# Patient Record
Sex: Male | Born: 1960 | Race: White | Hispanic: No | Marital: Married
Health system: Southern US, Community
[De-identification: ages and names within clinical notes are randomized; demographics above are authoritative.]

---

## 2011-04-07 ENCOUNTER — Ambulatory Visit: Payer: Self-pay | Admitting: Internal Medicine

## 2018-06-25 ENCOUNTER — Emergency Department: Payer: BLUE CROSS/BLUE SHIELD

## 2018-06-25 ENCOUNTER — Other Ambulatory Visit: Payer: Self-pay

## 2018-06-25 ENCOUNTER — Emergency Department
Admission: EM | Admit: 2018-06-25 | Discharge: 2018-06-25 | Disposition: A | Discharge status: Home / Self Care | Payer: BLUE CROSS/BLUE SHIELD | Attending: Emergency Medicine | Admitting: Emergency Medicine

## 2018-06-25 DIAGNOSIS — M5416 Radiculopathy, lumbar region: Secondary | ICD-10-CM | POA: Diagnosis not present

## 2018-06-25 DIAGNOSIS — K4090 Unilateral inguinal hernia, without obstruction or gangrene, not specified as recurrent: Secondary | ICD-10-CM

## 2018-06-25 DIAGNOSIS — K4021 Bilateral inguinal hernia, without obstruction or gangrene, recurrent: Secondary | ICD-10-CM | POA: Insufficient documentation

## 2018-06-25 DIAGNOSIS — R1031 Right lower quadrant pain: Secondary | ICD-10-CM | POA: Diagnosis present

## 2018-06-25 DIAGNOSIS — K5790 Diverticulosis of intestine, part unspecified, without perforation or abscess without bleeding: Secondary | ICD-10-CM | POA: Insufficient documentation

## 2018-06-25 LAB — CBC
HCT: 45.5 % (ref 39.0–52.0)
Hemoglobin: 15.4 g/dL (ref 13.0–17.0)
MCH: 31.1 pg (ref 26.0–34.0)
MCHC: 33.8 g/dL (ref 30.0–36.0)
MCV: 91.9 fL (ref 80.0–100.0)
Platelets: 352 10*3/uL (ref 150–400)
RBC: 4.95 MIL/uL (ref 4.22–5.81)
RDW: 13.2 % (ref 11.5–15.5)
WBC: 11.8 10*3/uL — ABNORMAL HIGH (ref 4.0–10.5)
nRBC: 0 % (ref 0.0–0.2)

## 2018-06-25 LAB — COMPREHENSIVE METABOLIC PANEL
ALT: 24 U/L (ref 0–44)
AST: 27 U/L (ref 15–41)
Albumin: 4.1 g/dL (ref 3.5–5.0)
Alkaline Phosphatase: 75 U/L (ref 38–126)
Anion gap: 9 (ref 5–15)
BUN: 12 mg/dL (ref 6–20)
CO2: 22 mmol/L (ref 22–32)
Calcium: 8.9 mg/dL (ref 8.9–10.3)
Chloride: 104 mmol/L (ref 98–111)
Creatinine, Ser: 0.89 mg/dL (ref 0.61–1.24)
GFR calc Af Amer: >60 mL/min (ref >60)
GFR calc non Af Amer: >60 mL/min (ref >60)
Glucose, Bld: 112 mg/dL — ABNORMAL HIGH (ref 70–99)
Potassium: 3.8 mmol/L (ref 3.5–5.1)
Sodium: 135 mmol/L (ref 135–145)
Total Bilirubin: 0.8 mg/dL (ref 0.3–1.2)
Total Protein: 7.2 g/dL (ref 6.5–8.1)

## 2018-06-25 LAB — URINALYSIS, COMPLETE (UACMP) WITH MICROSCOPIC
BACTERIA UA: NONE SEEN
Bilirubin Urine: NEGATIVE
Glucose, UA: NEGATIVE mg/dL
Ketones, ur: 5 mg/dL — AB
Leukocytes, UA: NEGATIVE
Nitrite: NEGATIVE
Protein, ur: NEGATIVE mg/dL
Specific Gravity, Urine: 1.017 (ref 1.005–1.030)
pH: 6 (ref 5.0–8.0)

## 2018-06-25 LAB — LIPASE, BLOOD: Lipase: 33 U/L (ref 11–51)

## 2018-06-25 MED ORDER — ONDANSETRON HCL 4 MG/2ML IJ SOLN
4.0000 mg | Freq: Once | INTRAMUSCULAR | Status: AC
  Administered 2018-06-25: 4 mg via INTRAVENOUS

## 2018-06-25 MED ORDER — IOPAMIDOL (ISOVUE-300) INJECTION 61%
100.0000 mL | Freq: Once | INTRAVENOUS | Status: AC | PRN
  Administered 2018-06-25: 100 mL via INTRAVENOUS
  Filled 2018-06-25: qty 100

## 2018-06-25 MED ORDER — MORPHINE SULFATE (PF) 4 MG/ML IV SOLN
4.0000 mg | Freq: Once | INTRAVENOUS | Status: AC
  Administered 2018-06-25: 4 mg via INTRAVENOUS

## 2018-06-25 MED ORDER — MORPHINE SULFATE (PF) 4 MG/ML IV SOLN
INTRAVENOUS | Status: AC
  Filled 2018-06-25: qty 1

## 2018-06-25 MED ORDER — ONDANSETRON HCL 4 MG/2ML IJ SOLN
INTRAMUSCULAR | Status: AC
  Filled 2018-06-25: qty 2

## 2018-06-25 NOTE — ED Notes (Signed)
First Nurse Note: Patient states "my hernia has popped out and I can't get it back".  Patient indicates right lower abdomen.  Patient states the hernia came out at approx. 0630 this AM.

## 2018-06-25 NOTE — ED Provider Notes (Signed)
[facility] Emergency Department Provider Note ______   First MD Initiated Contact with Patient 06/25/18 0935     (approximate)  I have reviewed the triage vital signs and the nursing notes.   HISTORY  Chief Complaint Abdominal Pain    HPI George Phillips is a 58 y.o. male with history of right inguinal hernia presents to the emergency department with acute onset of right inguinal pain following coughing this morning.  Patient states that the area started to bulge twice as large as usual completing 10 out of 10 discomfort.  Patient denies any nausea vomiting diarrhea constipation.  Patient denies any urinary symptoms.   Past medical history Right inguinal hernia There are no active problems to display for this patient.     Prior to Admission medications   Not on File    Allergies Drug allergies No family history on file.  Social History Social History   Tobacco Use  . Smoking status: Not on file  Substance Use Topics  . Alcohol use: Not on file  . Drug use: Not on file    Review of Systems Constitutional: No fever/chills Eyes: No visual changes. ENT: No sore throat. Cardiovascular: Denies chest pain. Respiratory: Denies shortness of breath. Gastrointestinal: Positive for right inguinal pain.   No nausea, no vomiting.  No diarrhea.  No constipation. Genitourinary: Negative for dysuria. Musculoskeletal: Negative for neck pain.  Negative for back pain. Integumentary: Negative for rash. Neurological: Negative for headaches, focal weakness or numbness.   ____________________________________________   PHYSICAL EXAM:  VITAL SIGNS: ED Triage Vitals  Enc Vitals Group     BP 06/25/18 0827 (!) 179/114     Pulse Rate 06/25/18 0827 (!) 111     Resp 06/25/18 0827 20     Temp 06/25/18 0827 97.8 F (36.6 C)     Temp Source 06/25/18 0827 Oral     SpO2 06/25/18 0827 98 %     Weight 06/25/18 0828 70.3 kg (155 lb)     Height 06/25/18 0828  1.702 m (5\' 7" )     Head Circumference --      Peak Flow --      Pain Score 06/25/18 0828 6     Pain Loc --      Pain Edu? --      Excl. in GC? --     Constitutional: Alert and oriented.  Apparent discomfort.   Eyes: Conjunctivae are normal.  Mouth/Throat: Mucous membranes are moist. Oropharynx non-erythematous. Neck: No stridor. Cardiovascular: Normal rate, regular rhythm. Good peripheral circulation. Grossly normal heart sounds. Respiratory: Normal respiratory effort.  No retractions. Lungs CTAB. Gastrointestinal: Palpable right inguinal hernia.. No distention.  Musculoskeletal: No lower extremity tenderness nor edema. No gross deformities of extremities. Neurologic:  Normal speech and language. No gross focal neurologic deficits are appreciated.  Skin:  Skin is warm, dry and intact. No rash noted.  ____________________________________________   LABS (all labs ordered are listed, but only abnormal results are displayed)  Labs Reviewed  COMPREHENSIVE METABOLIC PANEL - Abnormal; Notable for the following components:      Result Value   Glucose, Bld 112 (*)    All other components within normal limits  CBC - Abnormal; Notable for the following components:   WBC 11.8 (*)    All other components within normal limits  URINALYSIS, COMPLETE (UACMP) WITH MICROSCOPIC - Abnormal; Notable for the following components:   Color, Urine YELLOW (*)    APPearance CLEAR (*)  Hgb urine dipstick SMALL (*)    Ketones, ur 5 (*)    All other components within normal limits  LIPASE, BLOOD    ____________________________________________  RADIOLOGY I, [place] N Olinda Nola, personally viewed and evaluated these images (plain radiographs) as part of my medical decision making, as well as reviewing the written report by the radiologist.  ED MD interpretation: Small bilateral inguinal hernia right greater than the left only containing fat.  Diverticulosis and L5-S1 soft disc bulge with right S1 nerve  compression noted on CT scan per radiologist.  Official radiology report(s): Ct Abdomen Pelvis W Contrast  Result Date: 06/25/2018 CLINICAL DATA:  Right inguinal pain and bulging after the patient coughed this morning. EXAM: CT ABDOMEN AND PELVIS WITH CONTRAST TECHNIQUE: Multidetector CT imaging of the abdomen and pelvis was performed using the standard protocol following bolus administration of intravenous contrast. CONTRAST:  100mL ISOVUE-300 IOPAMIDOL (ISOVUE-300) INJECTION 61% COMPARISON:  None. FINDINGS: Lower chest: Normal. Hepatobiliary: No focal liver abnormality is seen. No gallstones, gallbladder wall thickening, or biliary dilatation. Pancreas: Unremarkable. No pancreatic ductal dilatation or surrounding inflammatory changes. Spleen: Normal in size without focal abnormality. Adrenals/Urinary Tract: Adrenal glands are unremarkable. Kidneys are normal, without renal calculi, focal lesion, or hydronephrosis. Bladder is unremarkable. Stomach/Bowel: Stomach is within normal limits. Appendix appears normal. No evidence of bowel wall thickening, distention, or inflammatory changes. There are a few scattered diverticula in the colon. No diverticulitis. Vascular/Lymphatic: No significant vascular findings are present. No enlarged abdominal or pelvic lymph nodes. Reproductive: Prostate is unremarkable. Other: There are small bilateral inguinal hernias containing fat, larger on the right than the left with slight soft tissue stranding in the right hernia. There is no bowel within the hernias. No ascites. Musculoskeletal: No acute abnormalities. Slight arthritis of the left hip. Degenerative disc disease at L5-S1 with a soft disc extrusion extending inferiorly in the right lateral recess compressing the right S1 nerve. IMPRESSION: 1. Small bilateral inguinal hernias, right greater than left containing only fat. Slight soft tissue stranding in the right inguinal hernia. 2. Soft disc extrusion at L5-S1 to the right  compressing the right S1 nerve. 3. Scattered diverticula in the colon. Electronically Signed   By: Francene BoyersJames  Maxwell M.D.   On: 06/25/2018 10:36      Procedures   ____________________________________________   INITIAL IMPRESSION / ASSESSMENT AND PLAN / ED COURSE  As part of my medical decision making, I reviewed the following data within the electronic MEDICAL RECORD NUMBER   58 year old male presenting with above-stated history and physical exam concerning for possible incarcerated right inguinal hernia.  Patient given IV morphine 4 mg and Zofran 4 mg.  Following administration.  Hernia was manually reduced with resolution of pain.  CT scan revealed bilateral inguinal hernia with only fat noted within the hernia.  Patient notified of all clinical findings including diverticulosis and lumbar radiculopathy L5-S1 disc protrusion. ____________________________________________  FINAL CLINICAL IMPRESSION(S) / ED DIAGNOSES  Final diagnoses:  Right inguinal hernia  Bilateral recurrent inguinal hernia without obstruction or gangrene  Diverticulosis  Lumbar radiculopathy     MEDICATIONS GIVEN DURING THIS VISIT:  Medications  morphine 4 MG/ML injection 4 mg (4 mg Intravenous Given 06/25/18 0948)  ondansetron (ZOFRAN) injection 4 mg (4 mg Intravenous Given 06/25/18 0949)  iopamidol (ISOVUE-300) 61 % injection 100 mL (100 mLs Intravenous Contrast Given 06/25/18 1014)     ED Discharge Orders    None       Note:  This document was prepared using Dragon  voice recognition software and may include unintentional dictation errors.     Darci CurrentBrown, Heber N, MD 06/25/18 1059

## 2018-06-25 NOTE — ED Triage Notes (Signed)
Patient states he coughed at approx 0630 this AM and "my hernia popped out".  Has obvious right inguinal enlargement.  Patient states he has never been diagnosed with a hernia.  States he can usually reduce it himself but unable to push it back in this time.

## 2018-06-25 NOTE — ED Notes (Signed)
Patient presents c/o hernia "popping out" around 6:30am--Dr Manson Passey to bedside for examination.

## 2020-07-10 IMAGING — CT CT ABD-PELV W/ CM
2 of 5 series · 16 of 46 positions shown, 18 images · IV contrast (APPLIED)
Comparison: None.

CLINICAL DATA: Right inguinal pain and bulging after the patient
coughed this morning.

EXAM:
CT ABDOMEN AND PELVIS WITH CONTRAST
TECHNIQUE: Multidetector CT imaging of the abdomen and pelvis was performed
using the standard protocol following bolus administration of
intravenous contrast.
CONTRAST:  100mL [id number] IOPAMIDOL ([id number]) INJECTION 61%

[Series 2: routine abd/pel with · axial · 0.69mm/px · z∈[-664,-254]mm · 13 of 92 slices shown, 15 images]
[im 5/92  soft-tissue]
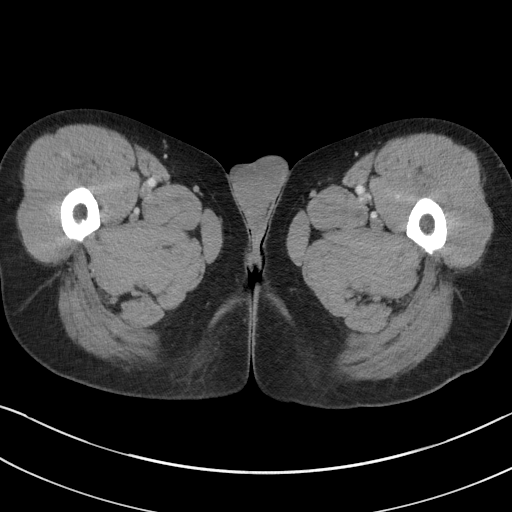
[im 5/92  bone]
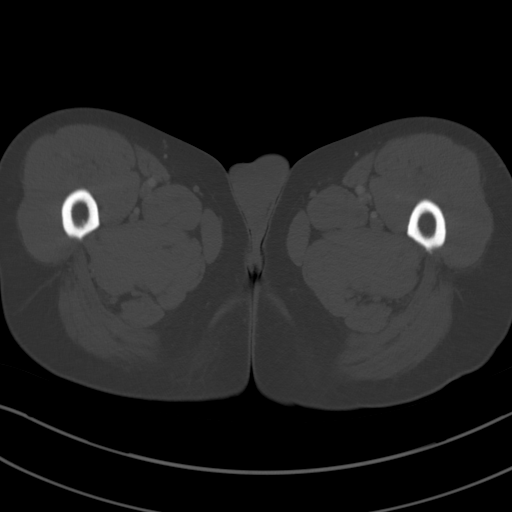
[im 14/92  soft-tissue]
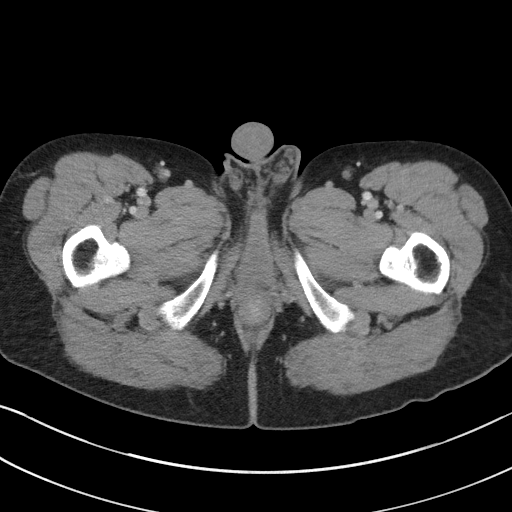
[im 18/92  soft-tissue]
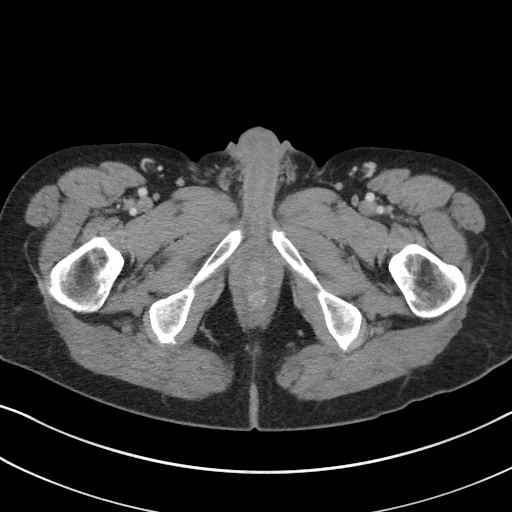
[im 27/92  soft-tissue]
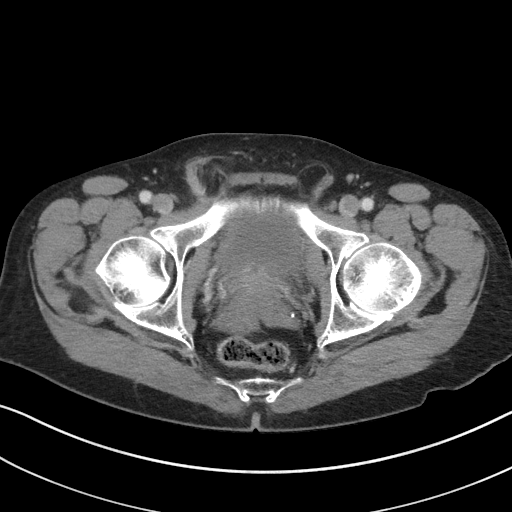
[im 31/92  soft-tissue]
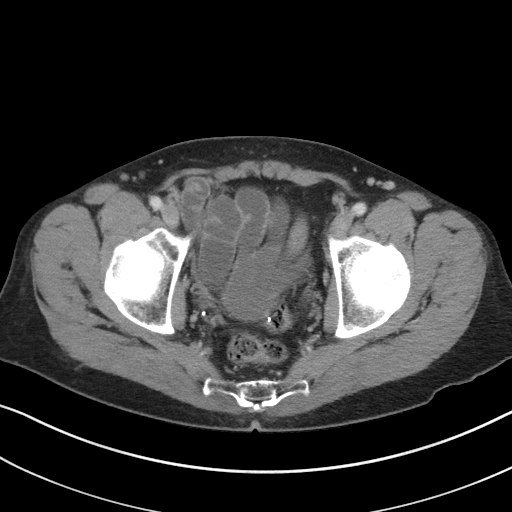
[im 40/92  soft-tissue]
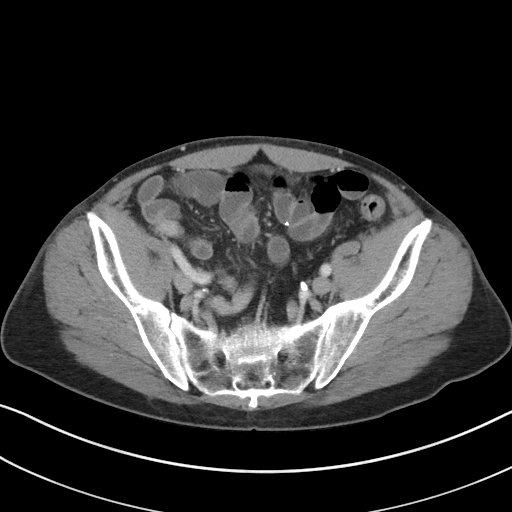
[im 48/92  soft-tissue]
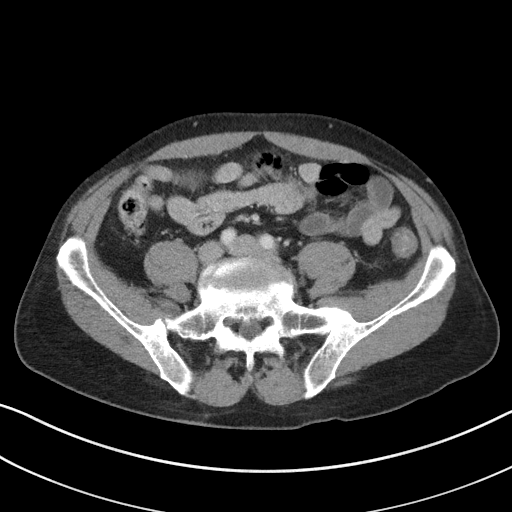
[im 53/92  soft-tissue]
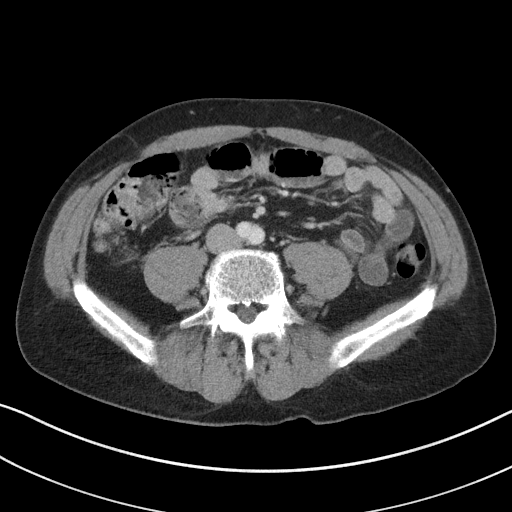
[im 61/92  soft-tissue]
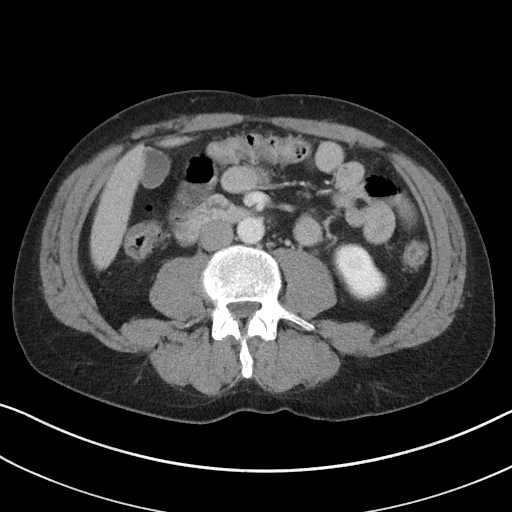
[im 61/92  bone]
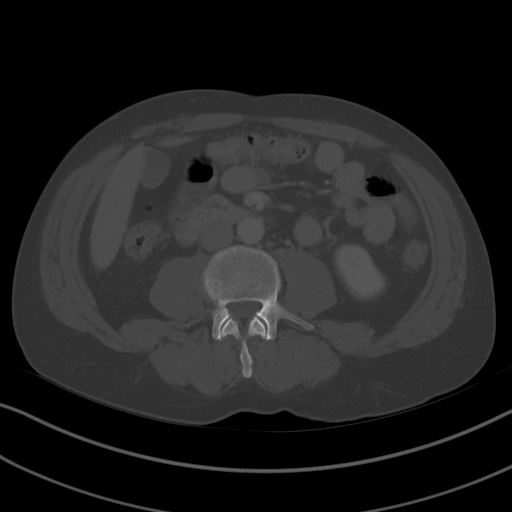
[im 66/92  soft-tissue]
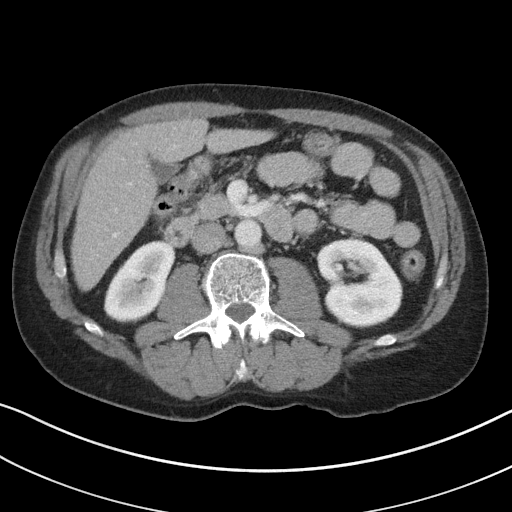
[im 74/92  soft-tissue]
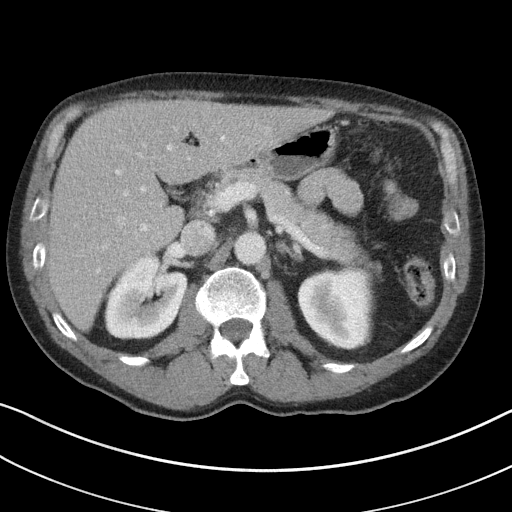
[im 79/92  soft-tissue]
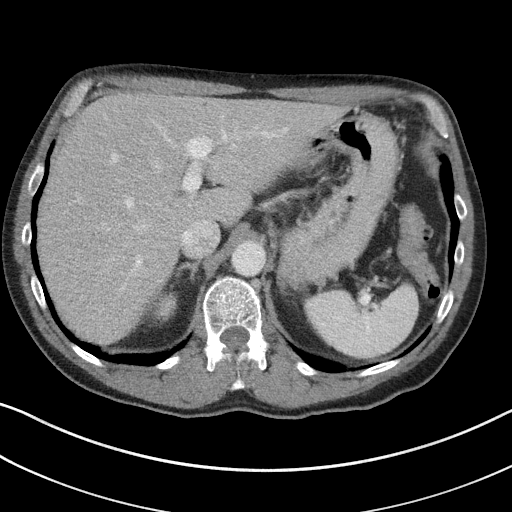
[im 87/92  soft-tissue]
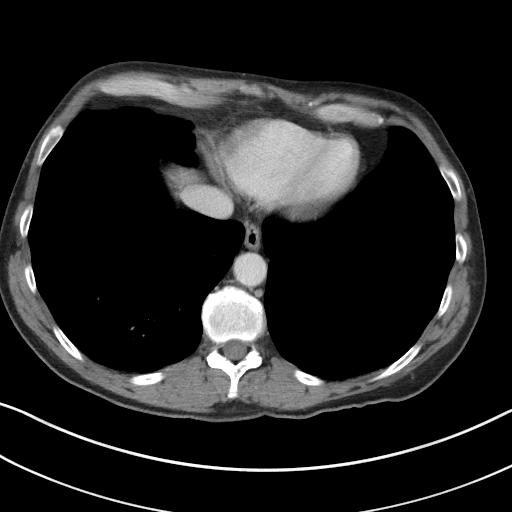

[Series 6: coronal st · coronal · 0.69mm/px · 3 of 80 slices shown]
[im 27/80  soft-tissue]
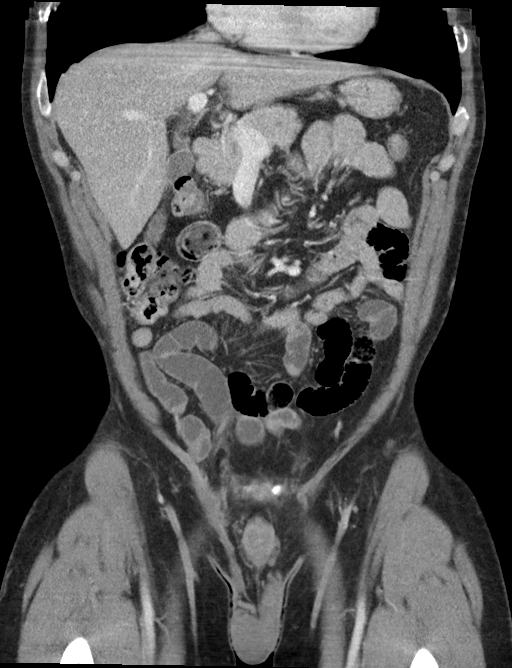
[im 36/80  soft-tissue]
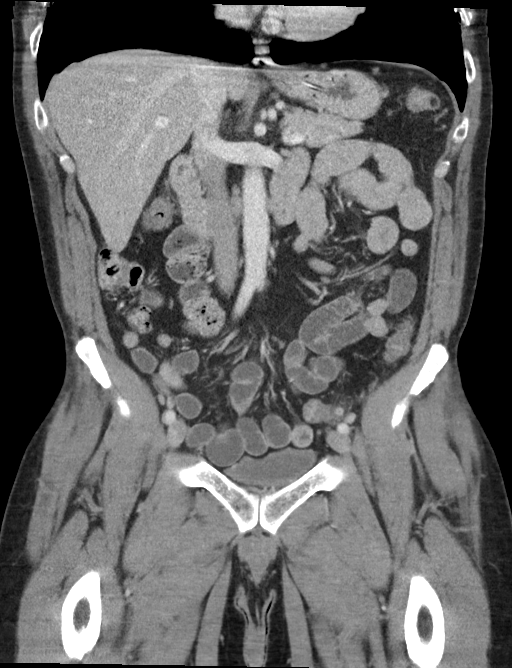
[im 44/80  soft-tissue]
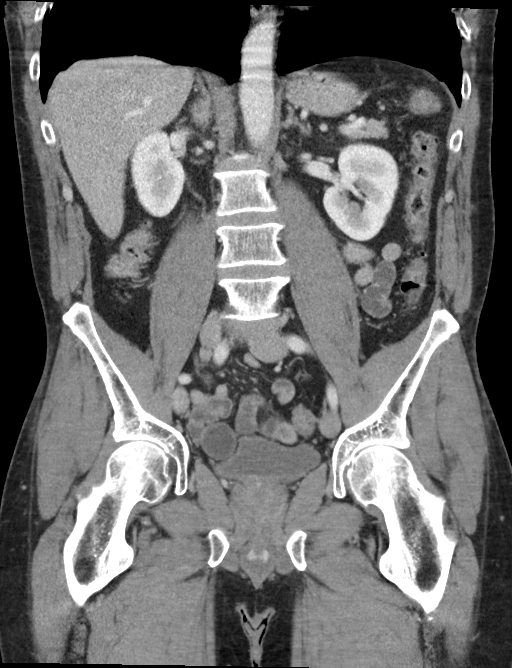

[16 of 46 positions shown; findings below may reference images not displayed]

FINDINGS: Lower chest: Normal.

Hepatobiliary: No focal liver abnormality is seen. No gallstones,
gallbladder wall thickening, or biliary dilatation.

Pancreas: Unremarkable. No pancreatic ductal dilatation or
surrounding inflammatory changes.

Spleen: Normal in size without focal abnormality.

Adrenals/Urinary Tract: Adrenal glands are unremarkable. Kidneys are
normal, without renal calculi, focal lesion, or hydronephrosis.
Bladder is unremarkable.

Stomach/Bowel: Stomach is within normal limits. Appendix appears
normal. No evidence of bowel wall thickening, distention, or
inflammatory changes. There are a few scattered diverticula in the
colon. No diverticulitis.

Vascular/Lymphatic: No significant vascular findings are present. No
enlarged abdominal or pelvic lymph nodes.

Reproductive: Prostate is unremarkable.

Other: There are small bilateral inguinal hernias containing fat,
larger on the right than the left with slight soft tissue stranding
in the right hernia. There is no bowel within the hernias. No
ascites.

Musculoskeletal: No acute abnormalities. Slight arthritis of the
left hip. Degenerative disc disease at L5-S1 with a soft disc
extrusion extending inferiorly in the right lateral recess
compressing the right S1 nerve.
IMPRESSION: 1. Small bilateral inguinal hernias, right greater than left
containing only fat. Slight soft tissue stranding in the right
inguinal hernia.
2. Soft disc extrusion at L5-S1 to the right compressing the right
S1 nerve.
3. Scattered diverticula in the colon.
